# Patient Record
Sex: Female | Born: 1955 | Race: White | Hispanic: No | Marital: Married
Health system: Southern US, Community
[De-identification: ages and names within clinical notes are randomized; demographics above are authoritative.]

---

## 1998-01-09 ENCOUNTER — Ambulatory Visit (HOSPITAL_COMMUNITY): Admission: RE | Admit: 1998-01-09 | Discharge: 1998-01-09 | Payer: Self-pay | Admitting: Obstetrics and Gynecology

## 1998-08-09 ENCOUNTER — Other Ambulatory Visit: Admission: RE | Admit: 1998-08-09 | Discharge: 1998-08-09 | Payer: Self-pay | Admitting: Obstetrics and Gynecology

## 1998-10-12 ENCOUNTER — Other Ambulatory Visit: Admission: RE | Admit: 1998-10-12 | Discharge: 1998-10-12 | Payer: Self-pay | Admitting: Obstetrics and Gynecology

## 1999-01-25 ENCOUNTER — Other Ambulatory Visit: Admission: RE | Admit: 1999-01-25 | Discharge: 1999-01-25 | Payer: Self-pay | Admitting: Otolaryngology

## 1999-05-13 ENCOUNTER — Other Ambulatory Visit: Admission: RE | Admit: 1999-05-13 | Discharge: 1999-05-13 | Payer: Self-pay | Admitting: Obstetrics and Gynecology

## 1999-05-16 ENCOUNTER — Ambulatory Visit (HOSPITAL_COMMUNITY): Admission: RE | Admit: 1999-05-16 | Discharge: 1999-05-16 | Payer: Self-pay | Admitting: Obstetrics and Gynecology

## 1999-05-16 ENCOUNTER — Encounter: Payer: Self-pay | Admitting: Obstetrics and Gynecology

## 1999-07-10 ENCOUNTER — Other Ambulatory Visit: Admission: RE | Admit: 1999-07-10 | Discharge: 1999-07-10 | Payer: Self-pay | Admitting: Obstetrics and Gynecology

## 1999-07-10 ENCOUNTER — Encounter (INDEPENDENT_AMBULATORY_CARE_PROVIDER_SITE_OTHER): Payer: Self-pay

## 2000-02-03 ENCOUNTER — Other Ambulatory Visit: Admission: RE | Admit: 2000-02-03 | Discharge: 2000-02-03 | Payer: Self-pay | Admitting: Obstetrics and Gynecology

## 2004-01-19 ENCOUNTER — Other Ambulatory Visit: Admission: RE | Admit: 2004-01-19 | Discharge: 2004-01-19 | Payer: Self-pay | Admitting: Obstetrics and Gynecology

## 2004-05-15 ENCOUNTER — Encounter (INDEPENDENT_AMBULATORY_CARE_PROVIDER_SITE_OTHER): Payer: Self-pay | Admitting: Specialist

## 2004-05-15 ENCOUNTER — Ambulatory Visit (HOSPITAL_COMMUNITY): Admission: RE | Admit: 2004-05-15 | Discharge: 2004-05-15 | Payer: Self-pay | Admitting: Obstetrics and Gynecology

## 2004-05-15 ENCOUNTER — Ambulatory Visit (HOSPITAL_BASED_OUTPATIENT_CLINIC_OR_DEPARTMENT_OTHER): Admission: RE | Admit: 2004-05-15 | Discharge: 2004-05-15 | Payer: Self-pay | Admitting: Obstetrics and Gynecology

## 2005-02-04 ENCOUNTER — Ambulatory Visit (HOSPITAL_COMMUNITY): Admission: RE | Admit: 2005-02-04 | Discharge: 2005-02-04 | Payer: Self-pay | Admitting: Obstetrics and Gynecology

## 2008-03-30 ENCOUNTER — Ambulatory Visit: Payer: Self-pay | Admitting: Obstetrics and Gynecology

## 2010-11-24 ENCOUNTER — Encounter: Payer: Self-pay | Admitting: Obstetrics and Gynecology

## 2011-03-21 NOTE — Op Note (Signed)
NAME:  Angelica Ford, Angelica Ford                            ACCOUNT NO.:  [id number]   MEDICAL RECORD NO.:  [id number]                   PATIENT TYPE:  AMB   LOCATION:  [facility]                                 FACILITY:  [facility]   PHYSICIAN:  Sherry A. Rosalio Macadamia, M.D.           DATE OF BIRTH:  Oct 01, 1956   DATE OF PROCEDURE:  05/15/2004  DATE OF DISCHARGE:                                 OPERATIVE REPORT   PREOPERATIVE DIAGNOSES:  Postmenopausal bleeding, menorrhagia and  endometrial polyps.   POSTOPERATIVE DIAGNOSES:  Postmenopausal bleeding, menorrhagia and  endometrial polyps.   PROCEDURE:  D&C hysteroscopy with resectoscope.   SURGEON:  Sherry A. Rosalio Macadamia, M.D.   ANESTHESIA:  MAC.   INDICATIONS FOR PROCEDURE:  This is a 55 year old, G0, P0 woman who has had  persistent irregular and heavy bleeding.  The patient underwent a workup  which included an ultrasound.  Ultrasound revealed several small fibroids  throughout the uterus but very thickened endometrium with multiple  hypoechoic areas.  Ovaries were normal with a simple right ovarian cyst  present consistent with functional cyst.  Because of the presence of the  hypoechoic area, a diagnosis of endometrial polyps is made and the patient  is brought to the operating room for a D&C hysteroscopy with the  resectoscope.   FINDINGS:  A 7-8 week size anteflexed uterus, no adnexal mass.   DESCRIPTION OF PROCEDURE:  The patient was brought into the operating room,  given adequate IV sedation, she was placed in dorsal lithotomy position.  Her perineum was washed with Hibiclens, pelvic examination was performed.  The patient was draped in a sterile fashion, speculum was placed within the  vagina.  The vagina was washed with Hibiclens, paracervical block was  administered with 1% Nesacaine, the anterior lip of the cervix was grasped  with a single tooth tenaculum. The cervix was sounded, the cervix was  dilated with Shawnie Pons dilators to a #31. A  hysteroscope was introduced into the  endometrial cavity, pictures were obtained. Using a double loop right angled  resector, multiple polyps removed from the endometrial cavity. This was done  circumferentially. All tissue was removed, adequate hemostasis was present.  Pictures were obtained, all instruments were removed from the vagina, the  patient was taken out of the dorsal lithotomy position. She was awakened,  she was moved from the operating table to a stretcher in stable condition.  Complications were none. Estimated blood loss less than 5 mL.  Sorbitol  differential -80 mL.                                               Sherry A. Rosalio Macadamia, M.D.    SAD/MEDQ  D:  05/15/2004  T:  05/15/2004  Job:  [id number]

## 2017-01-29 DIAGNOSIS — R69 Illness, unspecified: Secondary | ICD-10-CM | POA: Diagnosis not present

## 2017-01-29 DIAGNOSIS — E7800 Pure hypercholesterolemia, unspecified: Secondary | ICD-10-CM | POA: Diagnosis not present

## 2017-01-29 DIAGNOSIS — E559 Vitamin D deficiency, unspecified: Secondary | ICD-10-CM | POA: Diagnosis not present

## 2017-01-29 DIAGNOSIS — Z79899 Other long term (current) drug therapy: Secondary | ICD-10-CM | POA: Diagnosis not present

## 2017-01-29 DIAGNOSIS — R7301 Impaired fasting glucose: Secondary | ICD-10-CM | POA: Diagnosis not present

## 2017-01-29 DIAGNOSIS — Z1211 Encounter for screening for malignant neoplasm of colon: Secondary | ICD-10-CM | POA: Diagnosis not present

## 2017-11-18 DIAGNOSIS — G5601 Carpal tunnel syndrome, right upper limb: Secondary | ICD-10-CM | POA: Diagnosis not present

## 2017-11-18 DIAGNOSIS — M79641 Pain in right hand: Secondary | ICD-10-CM | POA: Diagnosis not present

## 2017-11-18 DIAGNOSIS — M79644 Pain in right finger(s): Secondary | ICD-10-CM | POA: Diagnosis not present

## 2018-02-17 DIAGNOSIS — M9902 Segmental and somatic dysfunction of thoracic region: Secondary | ICD-10-CM | POA: Diagnosis not present

## 2018-02-17 DIAGNOSIS — M5032 Other cervical disc degeneration, mid-cervical region, unspecified level: Secondary | ICD-10-CM | POA: Diagnosis not present

## 2018-02-17 DIAGNOSIS — M9901 Segmental and somatic dysfunction of cervical region: Secondary | ICD-10-CM | POA: Diagnosis not present

## 2018-02-17 DIAGNOSIS — M531 Cervicobrachial syndrome: Secondary | ICD-10-CM | POA: Diagnosis not present

## 2018-02-23 DIAGNOSIS — M79642 Pain in left hand: Secondary | ICD-10-CM | POA: Diagnosis not present

## 2018-03-17 DIAGNOSIS — M79642 Pain in left hand: Secondary | ICD-10-CM | POA: Diagnosis not present

## 2018-03-17 DIAGNOSIS — M79641 Pain in right hand: Secondary | ICD-10-CM | POA: Diagnosis not present

## 2018-03-17 DIAGNOSIS — G5601 Carpal tunnel syndrome, right upper limb: Secondary | ICD-10-CM | POA: Diagnosis not present

## 2018-03-23 DIAGNOSIS — R69 Illness, unspecified: Secondary | ICD-10-CM | POA: Diagnosis not present

## 2018-03-24 DIAGNOSIS — M25532 Pain in left wrist: Secondary | ICD-10-CM | POA: Diagnosis not present

## 2018-03-31 DIAGNOSIS — G5601 Carpal tunnel syndrome, right upper limb: Secondary | ICD-10-CM | POA: Diagnosis not present

## 2018-03-31 DIAGNOSIS — M79642 Pain in left hand: Secondary | ICD-10-CM | POA: Diagnosis not present

## 2018-03-31 DIAGNOSIS — M542 Cervicalgia: Secondary | ICD-10-CM | POA: Diagnosis not present

## 2018-04-06 DIAGNOSIS — M79643 Pain in unspecified hand: Secondary | ICD-10-CM | POA: Diagnosis not present

## 2018-04-06 DIAGNOSIS — M542 Cervicalgia: Secondary | ICD-10-CM | POA: Diagnosis not present

## 2018-04-07 DIAGNOSIS — M542 Cervicalgia: Secondary | ICD-10-CM | POA: Diagnosis not present

## 2018-04-07 DIAGNOSIS — M79643 Pain in unspecified hand: Secondary | ICD-10-CM | POA: Diagnosis not present

## 2018-04-24 DIAGNOSIS — M79601 Pain in right arm: Secondary | ICD-10-CM | POA: Diagnosis not present

## 2018-04-24 DIAGNOSIS — G5601 Carpal tunnel syndrome, right upper limb: Secondary | ICD-10-CM | POA: Diagnosis not present

## 2018-04-27 DIAGNOSIS — M542 Cervicalgia: Secondary | ICD-10-CM | POA: Diagnosis not present

## 2018-05-04 DIAGNOSIS — M542 Cervicalgia: Secondary | ICD-10-CM | POA: Diagnosis not present

## 2018-05-04 DIAGNOSIS — G959 Disease of spinal cord, unspecified: Secondary | ICD-10-CM | POA: Diagnosis not present

## 2018-05-13 ENCOUNTER — Other Ambulatory Visit: Payer: Self-pay | Admitting: Neurosurgery

## 2018-05-13 DIAGNOSIS — R030 Elevated blood-pressure reading, without diagnosis of hypertension: Secondary | ICD-10-CM | POA: Diagnosis not present

## 2018-05-13 DIAGNOSIS — G959 Disease of spinal cord, unspecified: Secondary | ICD-10-CM | POA: Diagnosis not present

## 2018-05-13 DIAGNOSIS — Z6826 Body mass index (BMI) 26.0-26.9, adult: Secondary | ICD-10-CM | POA: Diagnosis not present

## 2018-05-17 ENCOUNTER — Other Ambulatory Visit: Payer: Self-pay | Admitting: Neurosurgery

## 2018-05-20 ENCOUNTER — Encounter (HOSPITAL_COMMUNITY): Payer: Self-pay | Admitting: Vascular Surgery
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
## 2018-05-31 DIAGNOSIS — R69 Illness, unspecified: Secondary | ICD-10-CM | POA: Diagnosis not present

## 2018-06-07 ENCOUNTER — Inpatient Hospital Stay (HOSPITAL_COMMUNITY)
Admission: RE | Admit: 2018-06-07 | Discharge: 2018-06-07 | Disposition: A | Discharge status: Home / Self Care | Payer: Self-pay | Source: Ambulatory Visit

## 2018-06-14 ENCOUNTER — Encounter (HOSPITAL_COMMUNITY): Payer: Self-pay

## 2018-06-14 ENCOUNTER — Inpatient Hospital Stay (HOSPITAL_COMMUNITY): Admit: 2018-06-14 | Payer: Self-pay | Admitting: Neurosurgery

## 2018-06-14 SURGERY — ANTERIOR CERVICAL DECOMPRESSION/DISCECTOMY FUSION 4 LEVELS
Anesthesia: General

## 2018-06-17 DIAGNOSIS — M4712 Other spondylosis with myelopathy, cervical region: Secondary | ICD-10-CM | POA: Diagnosis not present

## 2018-06-17 DIAGNOSIS — M5020 Other cervical disc displacement, unspecified cervical region: Secondary | ICD-10-CM | POA: Diagnosis not present

## 2018-07-21 DIAGNOSIS — R69 Illness, unspecified: Secondary | ICD-10-CM | POA: Diagnosis not present

## 2018-07-28 DIAGNOSIS — R69 Illness, unspecified: Secondary | ICD-10-CM | POA: Diagnosis not present

## 2018-08-02 DIAGNOSIS — Z01818 Encounter for other preprocedural examination: Secondary | ICD-10-CM | POA: Diagnosis not present

## 2018-08-02 DIAGNOSIS — M4322 Fusion of spine, cervical region: Secondary | ICD-10-CM | POA: Diagnosis not present

## 2018-08-31 DIAGNOSIS — M4712 Other spondylosis with myelopathy, cervical region: Secondary | ICD-10-CM | POA: Diagnosis not present

## 2018-09-01 DIAGNOSIS — M4802 Spinal stenosis, cervical region: Secondary | ICD-10-CM | POA: Diagnosis not present

## 2018-09-01 DIAGNOSIS — Z880 Allergy status to penicillin: Secondary | ICD-10-CM | POA: Diagnosis not present

## 2018-09-01 DIAGNOSIS — Z91048 Other nonmedicinal substance allergy status: Secondary | ICD-10-CM | POA: Diagnosis not present

## 2018-09-01 DIAGNOSIS — R1310 Dysphagia, unspecified: Secondary | ICD-10-CM | POA: Diagnosis not present

## 2018-09-01 DIAGNOSIS — M4322 Fusion of spine, cervical region: Secondary | ICD-10-CM | POA: Diagnosis not present

## 2018-09-01 DIAGNOSIS — M4712 Other spondylosis with myelopathy, cervical region: Secondary | ICD-10-CM | POA: Diagnosis not present

## 2018-09-05 DIAGNOSIS — R1310 Dysphagia, unspecified: Secondary | ICD-10-CM | POA: Diagnosis not present

## 2018-09-05 DIAGNOSIS — Z888 Allergy status to other drugs, medicaments and biological substances status: Secondary | ICD-10-CM | POA: Diagnosis not present

## 2018-09-05 DIAGNOSIS — Z79899 Other long term (current) drug therapy: Secondary | ICD-10-CM | POA: Diagnosis not present

## 2018-09-05 DIAGNOSIS — K208 Other esophagitis: Secondary | ICD-10-CM | POA: Diagnosis not present

## 2018-09-05 DIAGNOSIS — Z880 Allergy status to penicillin: Secondary | ICD-10-CM | POA: Diagnosis not present

## 2018-09-05 DIAGNOSIS — Z981 Arthrodesis status: Secondary | ICD-10-CM | POA: Diagnosis not present

## 2018-09-05 DIAGNOSIS — K209 Esophagitis, unspecified: Secondary | ICD-10-CM | POA: Diagnosis not present

## 2018-09-08 DIAGNOSIS — M7989 Other specified soft tissue disorders: Secondary | ICD-10-CM | POA: Diagnosis not present

## 2018-09-08 DIAGNOSIS — Z9889 Other specified postprocedural states: Secondary | ICD-10-CM | POA: Diagnosis not present

## 2018-09-08 DIAGNOSIS — M4802 Spinal stenosis, cervical region: Secondary | ICD-10-CM | POA: Diagnosis not present

## 2018-09-08 DIAGNOSIS — M4712 Other spondylosis with myelopathy, cervical region: Secondary | ICD-10-CM | POA: Diagnosis not present

## 2018-09-08 DIAGNOSIS — I6521 Occlusion and stenosis of right carotid artery: Secondary | ICD-10-CM | POA: Diagnosis not present

## 2018-10-04 ENCOUNTER — Other Ambulatory Visit: Payer: Self-pay | Admitting: Nurse Practitioner

## 2018-10-04 ENCOUNTER — Ambulatory Visit
Admission: RE | Admit: 2018-10-04 | Discharge: 2018-10-04 | Disposition: A | Discharge status: Home / Self Care | Payer: Medicare HMO | Source: Ambulatory Visit | Attending: Nurse Practitioner | Admitting: Nurse Practitioner

## 2018-10-04 DIAGNOSIS — R1310 Dysphagia, unspecified: Secondary | ICD-10-CM

## 2018-10-04 DIAGNOSIS — M4322 Fusion of spine, cervical region: Secondary | ICD-10-CM | POA: Diagnosis not present

## 2018-10-08 DIAGNOSIS — R1310 Dysphagia, unspecified: Secondary | ICD-10-CM | POA: Diagnosis not present

## 2018-12-02 DIAGNOSIS — Z09 Encounter for follow-up examination after completed treatment for conditions other than malignant neoplasm: Secondary | ICD-10-CM | POA: Diagnosis not present

## 2018-12-02 DIAGNOSIS — M4712 Other spondylosis with myelopathy, cervical region: Secondary | ICD-10-CM | POA: Diagnosis not present

## 2018-12-29 DIAGNOSIS — R69 Illness, unspecified: Secondary | ICD-10-CM | POA: Diagnosis not present

## 2019-01-06 DIAGNOSIS — M79641 Pain in right hand: Secondary | ICD-10-CM | POA: Diagnosis not present

## 2019-01-06 DIAGNOSIS — G5601 Carpal tunnel syndrome, right upper limb: Secondary | ICD-10-CM | POA: Diagnosis not present

## 2019-01-11 DIAGNOSIS — R69 Illness, unspecified: Secondary | ICD-10-CM | POA: Diagnosis not present

## 2019-01-27 DIAGNOSIS — Z981 Arthrodesis status: Secondary | ICD-10-CM | POA: Diagnosis not present

## 2019-01-27 DIAGNOSIS — M4712 Other spondylosis with myelopathy, cervical region: Secondary | ICD-10-CM | POA: Diagnosis not present

## 2019-01-27 DIAGNOSIS — M542 Cervicalgia: Secondary | ICD-10-CM | POA: Diagnosis not present

## 2019-06-15 DIAGNOSIS — G5601 Carpal tunnel syndrome, right upper limb: Secondary | ICD-10-CM | POA: Diagnosis not present

## 2019-06-23 DIAGNOSIS — R69 Illness, unspecified: Secondary | ICD-10-CM | POA: Diagnosis not present

## 2020-03-23 IMAGING — CR DG CERVICAL SPINE 2 OR 3 VIEWS
3 series · 3 of 3 positions shown · non-contrast
Comparison: None.

CLINICAL DATA: Dysphagia status post cervical fusion.

EXAM:
CERVICAL SPINE - 2-3 VIEW

[w cervical spine lat]
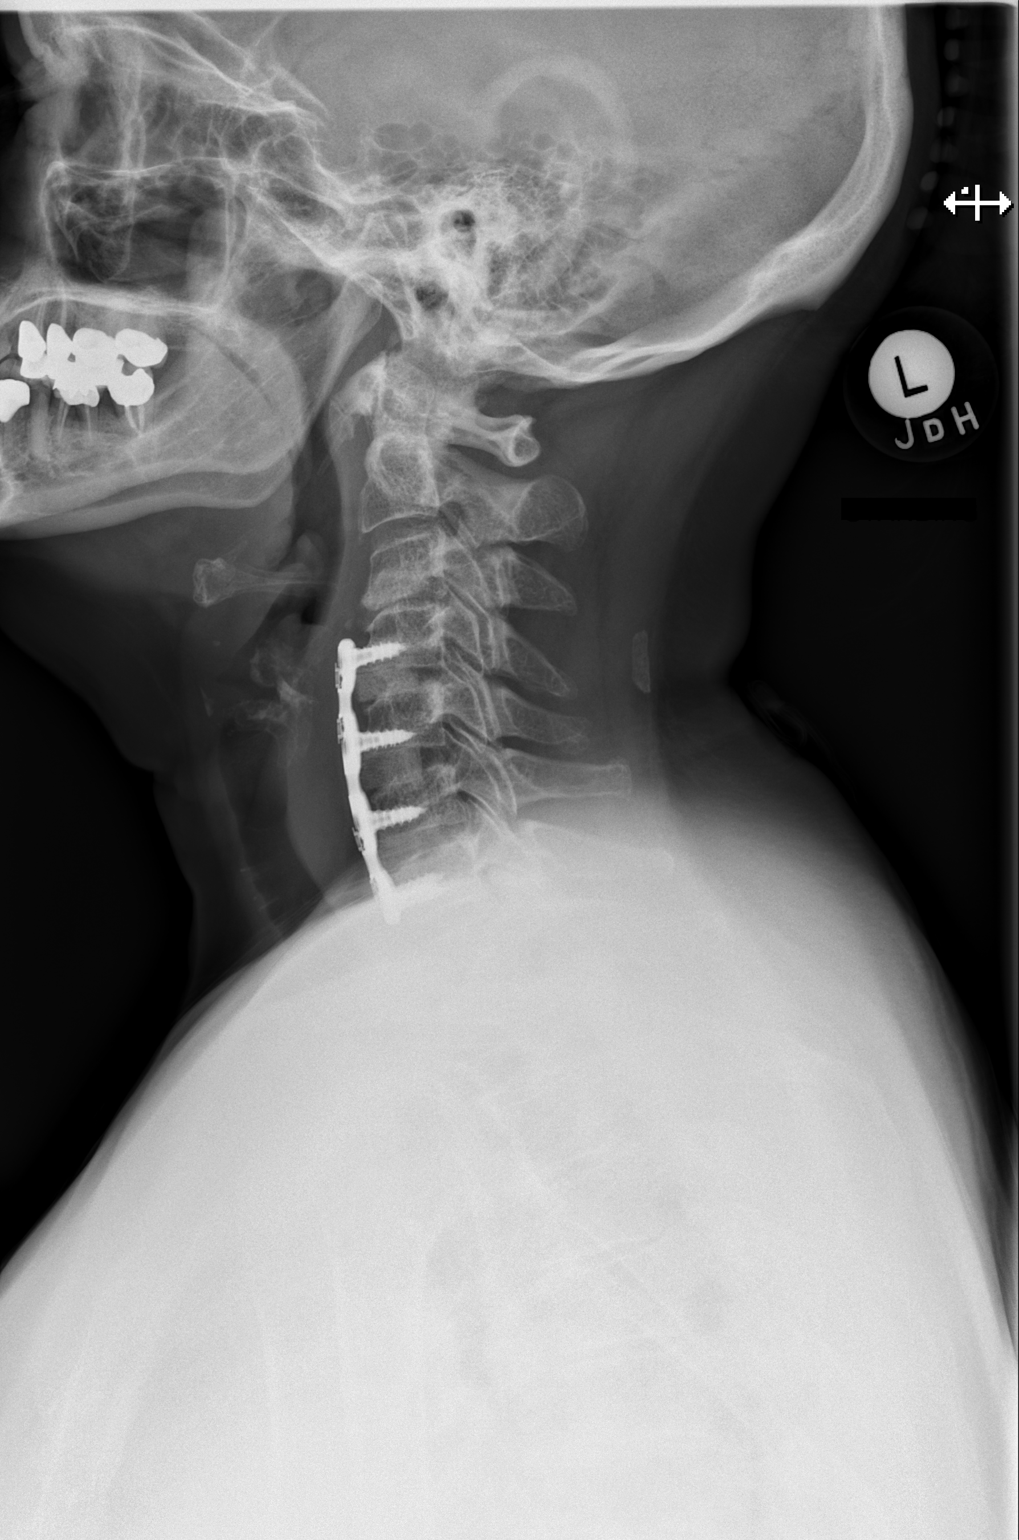

[w cervical swimmers]
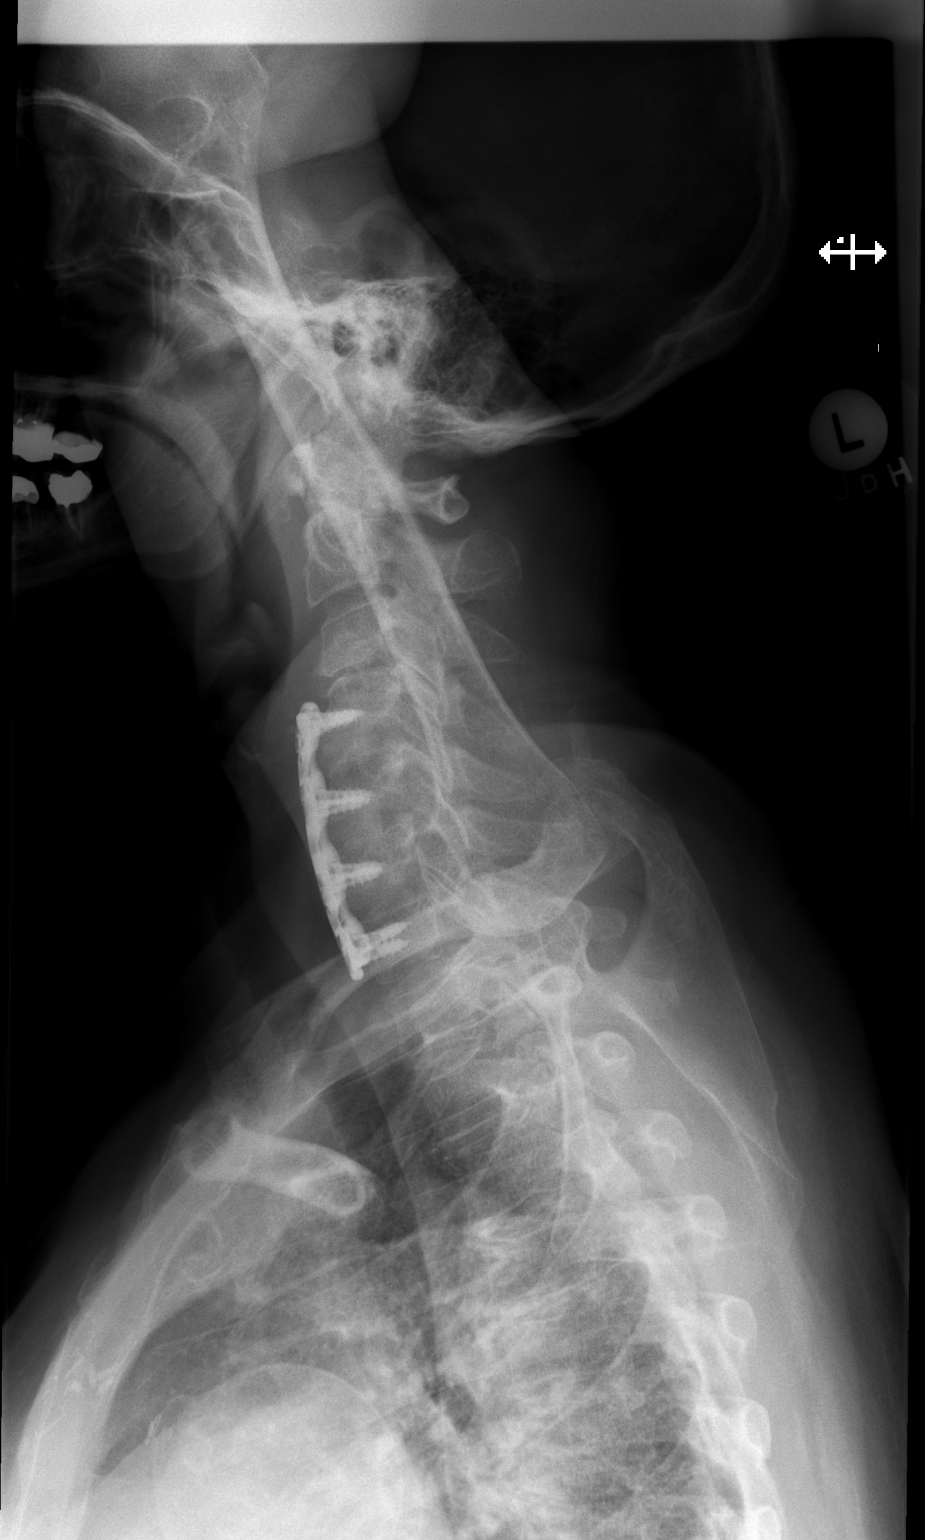

[w cervical spine ap]
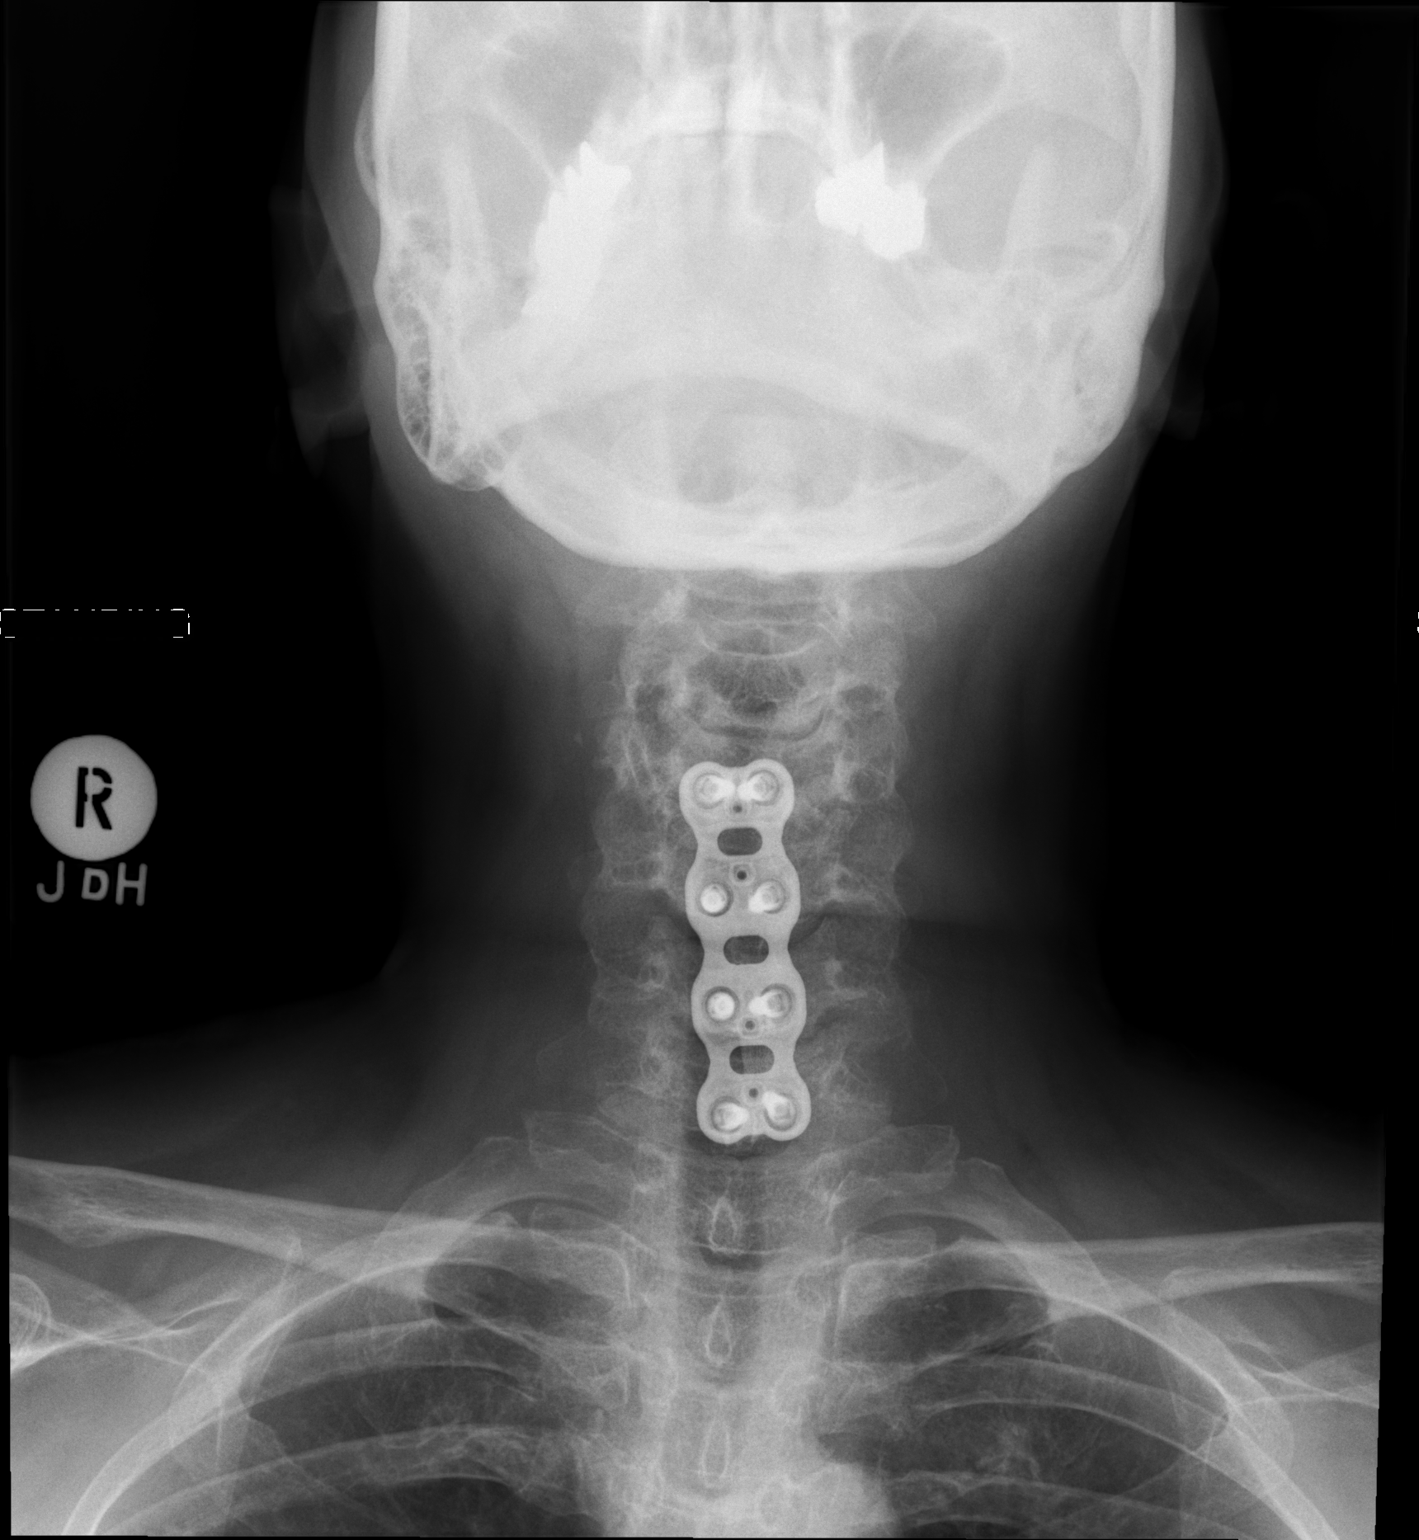

[3 of 3 positions shown; findings below may reference images not displayed]

FINDINGS: Status post surgical posterior fusion of C4-5, C5-6 and C6-7. Severe
degenerative disc disease is noted at C3-4. No fracture or
spondylolisthesis is noted. No fracture is noted. Stable
prevertebral soft tissue swelling is seen overlying lower cervical
spine.
IMPRESSION: Stable postsurgical and degenerative changes as described above.
Stable prevertebral soft tissue swelling is seen overlying lower
cervical spine.

## 2020-04-18 DIAGNOSIS — R69 Illness, unspecified: Secondary | ICD-10-CM | POA: Diagnosis not present

## 2020-05-02 DIAGNOSIS — A4151 Sepsis due to Escherichia coli [E. coli]: Secondary | ICD-10-CM | POA: Diagnosis not present

## 2020-05-02 DIAGNOSIS — R69 Illness, unspecified: Secondary | ICD-10-CM | POA: Diagnosis not present

## 2020-05-02 DIAGNOSIS — E11621 Type 2 diabetes mellitus with foot ulcer: Secondary | ICD-10-CM | POA: Diagnosis not present

## 2020-07-12 DIAGNOSIS — S60221A Contusion of right hand, initial encounter: Secondary | ICD-10-CM | POA: Diagnosis not present

## 2020-07-12 DIAGNOSIS — M542 Cervicalgia: Secondary | ICD-10-CM | POA: Diagnosis not present

## 2020-07-30 DIAGNOSIS — R69 Illness, unspecified: Secondary | ICD-10-CM | POA: Diagnosis not present

## 2021-10-02 DIAGNOSIS — Z0100 Encounter for examination of eyes and vision without abnormal findings: Secondary | ICD-10-CM | POA: Diagnosis not present

## 2021-10-02 DIAGNOSIS — H5200 Hypermetropia, unspecified eye: Secondary | ICD-10-CM | POA: Diagnosis not present
